# Patient Record
Sex: Male | Born: 1985 | Race: Black or African American | Hispanic: No | Marital: Single
Health system: Southern US, Community
[De-identification: ages and names within clinical notes are randomized; demographics above are authoritative.]

---

## 2018-01-09 ENCOUNTER — Encounter (HOSPITAL_COMMUNITY): Payer: Self-pay | Admitting: *Deleted

## 2018-01-09 ENCOUNTER — Emergency Department (HOSPITAL_COMMUNITY)
Admission: EM | Admit: 2018-01-09 | Discharge: 2018-01-09 | Disposition: A | Discharge status: Home / Self Care | Payer: No Typology Code available for payment source | Attending: Emergency Medicine | Admitting: Emergency Medicine

## 2018-01-09 ENCOUNTER — Emergency Department (HOSPITAL_COMMUNITY): Payer: No Typology Code available for payment source

## 2018-01-09 DIAGNOSIS — Y999 Unspecified external cause status: Secondary | ICD-10-CM | POA: Insufficient documentation

## 2018-01-09 DIAGNOSIS — Y9384 Activity, sleeping: Secondary | ICD-10-CM | POA: Insufficient documentation

## 2018-01-09 DIAGNOSIS — Z23 Encounter for immunization: Secondary | ICD-10-CM | POA: Insufficient documentation

## 2018-01-09 DIAGNOSIS — R51 Headache: Secondary | ICD-10-CM | POA: Insufficient documentation

## 2018-01-09 DIAGNOSIS — Y929 Unspecified place or not applicable: Secondary | ICD-10-CM | POA: Diagnosis not present

## 2018-01-09 DIAGNOSIS — S01512A Laceration without foreign body of oral cavity, initial encounter: Secondary | ICD-10-CM | POA: Diagnosis not present

## 2018-01-09 DIAGNOSIS — S01511A Laceration without foreign body of lip, initial encounter: Secondary | ICD-10-CM | POA: Insufficient documentation

## 2018-01-09 DIAGNOSIS — S0990XA Unspecified injury of head, initial encounter: Secondary | ICD-10-CM | POA: Diagnosis present

## 2018-01-09 MED ORDER — LIDOCAINE-EPINEPHRINE-TETRACAINE (LET) SOLUTION
3.0000 mL | Freq: Once | NASAL | Status: AC
  Administered 2018-01-09: 05:00:00 3 mL via TOPICAL
  Filled 2018-01-09: qty 3

## 2018-01-09 MED ORDER — TETANUS-DIPHTH-ACELL PERTUSSIS 5-2.5-18.5 LF-MCG/0.5 IM SUSP
0.5000 mL | Freq: Once | INTRAMUSCULAR | Status: AC
  Administered 2018-01-09: 0.5 mL via INTRAMUSCULAR
  Filled 2018-01-09: qty 0.5

## 2018-01-09 MED ORDER — ONDANSETRON HCL 4 MG/2ML IJ SOLN
4.0000 mg | Freq: Once | INTRAMUSCULAR | Status: AC
  Administered 2018-01-09: 4 mg via INTRAVENOUS
  Filled 2018-01-09: qty 2

## 2018-01-09 MED ORDER — LIDOCAINE-EPINEPHRINE (PF) 2 %-1:200000 IJ SOLN
10.0000 mL | Freq: Once | INTRAMUSCULAR | Status: AC
  Administered 2018-01-09: 10 mL via INTRADERMAL
  Filled 2018-01-09: qty 20

## 2018-01-09 MED ORDER — AMOXICILLIN-POT CLAVULANATE 875-125 MG PO TABS: 1.0000 | ORAL_TABLET | Freq: Two times a day (BID) | ORAL | 0 refills | Status: AC

## 2018-01-09 NOTE — Discharge Instructions (Addendum)
Keep your wound clean. Apply bacitracin or neosporin twice a day. Take antibiotic to prevent infection. The suture on the outside needs to be removed in 7 days. The sutures on the inside will dissolve in the next week. Please follow up as needed.
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
## 2018-01-09 NOTE — ED Notes (Signed)
Patient transported to CT scan . 
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
